# Patient Record
Sex: Male | Born: 1955 | Hispanic: Yes | Marital: Single | Smoking: Former smoker
Health system: Southern US, Community
[De-identification: ages and names within clinical notes are randomized; demographics above are authoritative.]

## PROBLEM LIST (undated history)

## (undated) DIAGNOSIS — E039 Hypothyroidism, unspecified: Secondary | ICD-10-CM

## (undated) DIAGNOSIS — D649 Anemia, unspecified: Secondary | ICD-10-CM

## (undated) HISTORY — PX: HERNIA REPAIR: SHX51

---

## 2017-05-17 ENCOUNTER — Encounter (INDEPENDENT_AMBULATORY_CARE_PROVIDER_SITE_OTHER): Payer: Self-pay

## 2017-05-17 ENCOUNTER — Other Ambulatory Visit: Payer: Self-pay

## 2017-05-17 ENCOUNTER — Encounter: Payer: Self-pay | Admitting: Gastroenterology

## 2017-05-17 ENCOUNTER — Other Ambulatory Visit
Admission: RE | Admit: 2017-05-17 | Discharge: 2017-05-17 | Disposition: A | Discharge status: Home / Self Care | Payer: BLUE CROSS/BLUE SHIELD | Source: Ambulatory Visit | Attending: Gastroenterology | Admitting: Gastroenterology

## 2017-05-17 ENCOUNTER — Ambulatory Visit: Payer: BLUE CROSS/BLUE SHIELD | Admitting: Gastroenterology

## 2017-05-17 VITALS — BP 106/65 | HR 70 | Ht 62.0 in | Wt 126.0 lb

## 2017-05-17 DIAGNOSIS — D509 Iron deficiency anemia, unspecified: Secondary | ICD-10-CM

## 2017-05-17 DIAGNOSIS — R748 Abnormal levels of other serum enzymes: Secondary | ICD-10-CM

## 2017-05-17 LAB — HEPATIC FUNCTION PANEL
ALBUMIN: 3.9 g/dL (ref 3.5–5.0)
ALK PHOS: 75 U/L (ref 38–126)
ALT: 42 U/L (ref 17–63)
AST: 57 U/L — ABNORMAL HIGH (ref 15–41)
Bilirubin, Direct: <0.1 mg/dL — ABNORMAL LOW (ref 0.1–0.5)
TOTAL PROTEIN: 8.1 g/dL (ref 6.5–8.1)
Total Bilirubin: 0.2 mg/dL — ABNORMAL LOW (ref 0.3–1.2)

## 2017-05-17 NOTE — Progress Notes (Signed)
Gastroenterology Consultation  Referring Provider:     Theotis Burrow* Primary Care Physician:  Theotis Burrow, MD Primary Gastroenterologist:  Dr. Allen Norris     Reason for Consultation:     Abnormal liver enzymes and anemia        HPI:   Seth Miles is a 62 y.o. y/o male referred for consultation & management of Abnormal liver enzymes and anemia by Dr. Alene Mires, Elyse Jarvis, MD.  This patient comes in today without any complaints but reports that he was told that he had abnormal liver enzymes and anemia.  The patient states that he had abnormal liver enzymes when he was child but doesn't recall what the reason was.  He also denies any alcohol abuse or jaundice.  The patient also states that he was put on iron supplementation because of his anemia.  There is a report of approximate 10 pound weight loss but the patient states he has recently gained most of it back.  He denies any fevers chills nausea or vomiting.  The patient also denies that he has ever had a colonoscopy in the past. I do not readily have the patient's blood work available to me at this office visit.  History reviewed. No pertinent past medical history.  History reviewed. No pertinent surgical history.  Prior to Admission medications   Medication Sig Start Date End Date Taking? Authorizing Provider  cholecalciferol (VITAMIN D) 1000 units tablet Take 1,000 Units by mouth daily.   Yes [provider]  Ferrous Sulfate (IRON) 325 (65 Fe) MG TABS Take by mouth.   Yes [provider]    History reviewed. No pertinent family history.   Social History   Tobacco Use  . Smoking status: Never Smoker  . Smokeless tobacco: Never Used  Substance Use Topics  . Alcohol use: No    Frequency: Never  . Drug use: No    Allergies as of 05/17/2017  . (No Known Allergies)    Review of Systems:    All systems reviewed and negative except where noted in HPI.   Physical Exam:  BP 106/65    Pulse 70   Ht 5' 2"  (1.575 m)   Wt 126 lb (57.2 kg)   BMI 23.05 kg/m  No LMP for male patient. Psych:  Alert and cooperative. Normal mood and affect. General:   Alert,  Well-developed, well-nourished, pleasant and cooperative in NAD Head:  Normocephalic and atraumatic. Eyes:  Sclera clear, no icterus.   Conjunctiva pink. Ears:  Normal auditory acuity. Nose:  No deformity, discharge, or lesions. Mouth:  No deformity or lesions,oropharynx pink & moist. Neck:  Supple; no masses or thyromegaly. Lungs:  Respirations even and unlabored.  Clear throughout to auscultation.   No wheezes, crackles, or rhonchi. No acute distress. Heart:  Regular rate and rhythm; no murmurs, clicks, rubs, or gallops. Abdomen:  Normal bowel sounds.  No bruits.  Soft, non-tender and non-distended without masses, hepatosplenomegaly or hernias noted.  No guarding or rebound tenderness.  Negative Carnett sign.   Rectal:  Deferred.  Msk:  Symmetrical without gross deformities.  Good, equal movement & strength bilaterally. Pulses:  Normal pulses noted. Extremities:  No clubbing or edema.  No cyanosis. Neurologic:  Alert and oriented x3;  grossly normal neurologically. Skin:  Intact without significant lesions or rashes.  No jaundice. Lymph Nodes:  No significant cervical adenopathy. Psych:  Alert and cooperative. Normal mood and affect.  Imaging Studies: No results found.  Assessment and  Plan:   Seth Miles is a 62 y.o. y/o male Who comes in with a history of anemia being treated with iron at this time.  The patient states that he had some weight loss but has gained most of the weight back.  There is no report of any fevers chills nausea or vomiting.  The patient also denies any black stools or bloody stools.  The patient also was reported to have abnormal liver enzymes.  The patient has not had any alcohol abuse or any other cause for his abnormal liver enzymes.  The patient also denies any takes any medications  other than vitamins.  The patient will be set up for blood work to look for other possible causes of his abnormal liver enzymes and will also be set up for a right upper quadrant ultrasound.  The patient will undergo a EGD and colonoscopy because of his anemia since he has not had either one of these in the past. I have discussed risks & benefits which include, but are not limited to, bleeding, infection, perforation & drug reaction.  The patient agrees with this plan & written consent will be obtained.     Lucilla Lame, MD. Marval Regal   Note: This dictation was prepared with Dragon dictation along with smaller phrase technology. Any transcriptional errors that result from this process are unintentional.

## 2017-05-17 NOTE — Patient Instructions (Signed)
You are scheduled for a RUQ abdominal US at [facility] on Tuesday, March 26th @ 8:30am. Please arrive at 8:15am and check in at the [facility] registration desk. You cannot have anything to eat or drink after midnight on Monday night.

## 2017-05-18 ENCOUNTER — Other Ambulatory Visit: Payer: Self-pay

## 2017-05-18 DIAGNOSIS — D500 Iron deficiency anemia secondary to blood loss (chronic): Secondary | ICD-10-CM

## 2017-05-18 LAB — HEPATITIS A ANTIBODY, TOTAL: HEP A TOTAL AB: NEGATIVE

## 2017-05-18 LAB — IRON AND TIBC
Iron: 94 ug/dL (ref 45–182)
SATURATION RATIOS: 29 % (ref 17.9–39.5)
TIBC: 320 ug/dL (ref 250–450)
UIBC: 226 ug/dL

## 2017-05-18 LAB — ALPHA-1-ANTITRYPSIN: A-1 Antitrypsin, Ser: 162 mg/dL (ref 90–200)

## 2017-05-18 LAB — HEPATITIS C ANTIBODY

## 2017-05-18 LAB — FERRITIN: Ferritin: 41 ng/mL (ref 24–336)

## 2017-05-18 LAB — HEPATITIS B SURFACE ANTIBODY,QUALITATIVE: Hep B S Ab: NONREACTIVE

## 2017-05-18 LAB — HEPATITIS B SURFACE ANTIGEN: HEP B S AG: NEGATIVE

## 2017-05-19 LAB — ANTI-SMOOTH MUSCLE ANTIBODY, IGG: F-Actin IgG: 16 Units (ref 0–19)

## 2017-05-19 LAB — MITOCHONDRIAL ANTIBODIES: Mitochondrial M2 Ab, IgG: <20 Units (ref 0.0–20.0)

## 2017-05-21 LAB — ANTINUCLEAR ANTIBODIES, IFA: ANA Ab, IFA: NEGATIVE

## 2017-05-29 ENCOUNTER — Telehealth: Payer: Self-pay

## 2017-05-29 ENCOUNTER — Other Ambulatory Visit: Payer: Self-pay

## 2017-05-29 DIAGNOSIS — R748 Abnormal levels of other serum enzymes: Secondary | ICD-10-CM

## 2017-05-29 NOTE — Telephone Encounter (Signed)
-----   Message from Lucilla Lame, MD sent at 05/28/2017  5:32 PM EDT ----- That the patient know that all of his blood tests came back without any cause for his elevated liver enzymes.  He should have his liver enzymes checked again in 3 weeks.

## 2017-05-29 NOTE — Telephone Encounter (Signed)
LVM for pt to return my call.

## 2017-05-29 NOTE — Telephone Encounter (Signed)
Pt notified of results and to repeat LFT's in 3 weeks. Order has been placed.

## 2017-06-12 ENCOUNTER — Ambulatory Visit: Admission: RE | Admit: 2017-06-12 | Payer: BLUE CROSS/BLUE SHIELD | Source: Ambulatory Visit

## 2017-06-12 ENCOUNTER — Other Ambulatory Visit: Payer: Self-pay

## 2017-06-12 MED ORDER — PEG 3350-KCL-NABCB-NACL-NASULF 236 G PO SOLR: ORAL | 0 refills | Status: AC

## 2017-06-14 ENCOUNTER — Ambulatory Visit
Admission: RE | Admit: 2017-06-14 | Discharge: 2017-06-14 | Disposition: A | Discharge status: Home / Self Care | Payer: BLUE CROSS/BLUE SHIELD | Source: Ambulatory Visit | Attending: Gastroenterology | Admitting: Gastroenterology

## 2017-06-14 DIAGNOSIS — R748 Abnormal levels of other serum enzymes: Secondary | ICD-10-CM | POA: Diagnosis present

## 2017-06-14 NOTE — Discharge Instructions (Signed)
General Anesthesia, Adult, Care After °These instructions provide you with information about caring for yourself after your procedure. Your health care provider may also give you more specific instructions. Your treatment has been planned according to current medical practices, but problems sometimes occur. Call your health care provider if you have any problems or questions after your procedure. °What can I expect after the procedure? °After the procedure, it is common to have: °· Vomiting. °· A sore throat. °· Mental slowness. ° °It is common to feel: °· Nauseous. °· Cold or shivery. °· Sleepy. °· Tired. °· Sore or achy, even in parts of your body where you did not have surgery. ° °Follow these instructions at home: °For at least 24 hours after the procedure: °· Do not: °? Participate in activities where you could fall or become injured. °? Drive. °? Use heavy machinery. °? Drink alcohol. °? Take sleeping pills or medicines that cause drowsiness. °? Make important decisions or sign legal documents. °? Take care of children on your own. °· Rest. °Eating and drinking °· If you vomit, drink water, juice, or soup when you can drink without vomiting. °· Drink enough fluid to keep your urine clear or pale yellow. °· Make sure you have little or no nausea before eating solid foods. °· Follow the diet recommended by your health care provider. °General instructions °· Have a responsible adult stay with you until you are awake and alert. °· Return to your normal activities as told by your health care provider. Ask your health care provider what activities are safe for you. °· Take over-the-counter and prescription medicines only as told by your health care provider. °· If you smoke, do not smoke without supervision. °· Keep all follow-up visits as told by your health care provider. This is important. °Contact a health care provider if: °· You continue to have nausea or vomiting at home, and medicines are not helpful. °· You  cannot drink fluids or start eating again. °· You cannot urinate after 8-12 hours. °· You develop a skin rash. °· You have fever. °· You have increasing redness at the site of your procedure. °Get help right away if: °· You have difficulty breathing. °· You have chest pain. °· You have unexpected bleeding. °· You feel that you are having a life-threatening or urgent problem. °This information is not intended to replace advice given to you by your health care provider. Make sure you discuss any questions you have with your health care provider. °Document Released: 06/12/2000 Document Revised: 08/09/2015 Document Reviewed: 02/18/2015 °Elsevier Interactive Patient Education © 2018 [employer] Inc. ° °

## 2017-06-15 ENCOUNTER — Ambulatory Visit
Admission: RE | Admit: 2017-06-15 | Discharge: 2017-06-15 | Disposition: A | Discharge status: Home / Self Care | Payer: BLUE CROSS/BLUE SHIELD | Source: Ambulatory Visit | Attending: Gastroenterology | Admitting: Gastroenterology

## 2017-06-15 ENCOUNTER — Ambulatory Visit: Payer: BLUE CROSS/BLUE SHIELD | Admitting: Anesthesiology

## 2017-06-15 ENCOUNTER — Encounter
Admission: RE | Disposition: A | Discharge status: Home / Self Care | Payer: Self-pay | Source: Ambulatory Visit | Attending: Gastroenterology

## 2017-06-15 ENCOUNTER — Encounter: Payer: Self-pay | Admitting: *Deleted

## 2017-06-15 DIAGNOSIS — K900 Celiac disease: Secondary | ICD-10-CM | POA: Diagnosis not present

## 2017-06-15 DIAGNOSIS — K3189 Other diseases of stomach and duodenum: Secondary | ICD-10-CM

## 2017-06-15 DIAGNOSIS — Z87891 Personal history of nicotine dependence: Secondary | ICD-10-CM | POA: Insufficient documentation

## 2017-06-15 DIAGNOSIS — Z79899 Other long term (current) drug therapy: Secondary | ICD-10-CM | POA: Insufficient documentation

## 2017-06-15 DIAGNOSIS — D509 Iron deficiency anemia, unspecified: Secondary | ICD-10-CM | POA: Insufficient documentation

## 2017-06-15 DIAGNOSIS — E039 Hypothyroidism, unspecified: Secondary | ICD-10-CM | POA: Diagnosis not present

## 2017-06-15 HISTORY — PX: ESOPHAGOGASTRODUODENOSCOPY (EGD) WITH PROPOFOL: SHX5813

## 2017-06-15 HISTORY — DX: Anemia, unspecified: D64.9

## 2017-06-15 HISTORY — DX: Hypothyroidism, unspecified: E03.9

## 2017-06-15 SURGERY — ESOPHAGOGASTRODUODENOSCOPY (EGD) WITH PROPOFOL
Anesthesia: General | Wound class: Clean Contaminated

## 2017-06-15 MED ORDER — PROPOFOL 10 MG/ML IV BOLUS
INTRAVENOUS | Status: DC | PRN
  Administered 2017-06-15: 60 mg via INTRAVENOUS
  Administered 2017-06-15: 90 mg via INTRAVENOUS
  Administered 2017-06-15 (×2): 25 mg via INTRAVENOUS

## 2017-06-15 MED ORDER — LIDOCAINE HCL (CARDIAC) 20 MG/ML IV SOLN
INTRAVENOUS | Status: DC | PRN
  Administered 2017-06-15: 25 mg via INTRAVENOUS

## 2017-06-15 MED ORDER — GLYCOPYRROLATE 0.2 MG/ML IJ SOLN
INTRAMUSCULAR | Status: DC | PRN
  Administered 2017-06-15: 0.1 mg via INTRAVENOUS

## 2017-06-15 MED ORDER — LACTATED RINGERS IV SOLN
1000.0000 mL | INTRAVENOUS | Status: DC
  Administered 2017-06-15: 11:00:00 via INTRAVENOUS
  Administered 2017-06-15: 1000 mL via INTRAVENOUS

## 2017-06-15 SURGICAL SUPPLY — 36 items
BALLN DILATOR 10-12 8 (BALLOONS)
BALLN DILATOR 12-15 8 (BALLOONS)
BALLN DILATOR 15-18 8 (BALLOONS)
BALLN DILATOR CRE 0-12 8 (BALLOONS)
BALLN DILATOR ESOPH 8 10 CRE (MISCELLANEOUS) IMPLANT
BALLOON DILATOR 12-15 8 (BALLOONS) IMPLANT
BALLOON DILATOR 15-18 8 (BALLOONS) IMPLANT
BALLOON DILATOR CRE 0-12 8 (BALLOONS) IMPLANT
BLOCK BITE 60FR ADLT L/F GRN (MISCELLANEOUS) ×3 IMPLANT
CANISTER SUCT 1200ML W/VALVE (MISCELLANEOUS) ×3 IMPLANT
CLIP HMST 235XBRD CATH ROT (MISCELLANEOUS) IMPLANT
CLIP RESOLUTION 360 11X235 (MISCELLANEOUS)
ELECT REM PT RETURN 9FT ADLT (ELECTROSURGICAL)
ELECTRODE REM PT RTRN 9FT ADLT (ELECTROSURGICAL) IMPLANT
FCP ESCP3.2XJMB 240X2.8X (MISCELLANEOUS)
FORCEPS BIOP RAD 4 LRG CAP 4 (CUTTING FORCEPS) ×3 IMPLANT
FORCEPS BIOP RJ4 240 W/NDL (MISCELLANEOUS)
FORCEPS ESCP3.2XJMB 240X2.8X (MISCELLANEOUS) IMPLANT
GOWN CVR UNV OPN BCK APRN NK (MISCELLANEOUS) ×2 IMPLANT
GOWN ISOL THUMB LOOP REG UNIV (MISCELLANEOUS) ×4
INJECTOR VARIJECT VIN23 (MISCELLANEOUS) IMPLANT
KIT DEFENDO VALVE AND CONN (KITS) IMPLANT
KIT ENDO PROCEDURE OLY (KITS) ×3 IMPLANT
MARKER SPOT ENDO TATTOO 5ML (MISCELLANEOUS) IMPLANT
PROBE APC STR FIRE (PROBE) IMPLANT
RETRIEVER NET PLAT FOOD (MISCELLANEOUS) IMPLANT
RETRIEVER NET ROTH 2.5X230 LF (MISCELLANEOUS) IMPLANT
SNARE SHORT THROW 13M SML OVAL (MISCELLANEOUS) IMPLANT
SNARE SHORT THROW 30M LRG OVAL (MISCELLANEOUS) IMPLANT
SNARE SNG USE RND 15MM (INSTRUMENTS) IMPLANT
SPOT EX ENDOSCOPIC TATTOO (MISCELLANEOUS)
SYR INFLATION 60ML (SYRINGE) IMPLANT
TRAP ETRAP POLY (MISCELLANEOUS) IMPLANT
VARIJECT INJECTOR VIN23 (MISCELLANEOUS)
WATER STERILE IRR 250ML POUR (IV SOLUTION) ×3 IMPLANT
WIRE CRE 18-20MM 8CM F G (MISCELLANEOUS) IMPLANT

## 2017-06-15 NOTE — Anesthesia Procedure Notes (Signed)
Procedure Name: MAC Performed by: Lind Guest, CRNA Pre-anesthesia Checklist: Patient identified, Emergency Drugs available, Suction available, Patient being monitored and Timeout performed Patient Re-evaluated:Patient Re-evaluated prior to induction Oxygen Delivery Method: Nasal cannula

## 2017-06-15 NOTE — Op Note (Signed)
[facility] Gastroenterology Patient Name: Seth Miles [facility] Procedure Date: 06/15/2017 10:50 AM MRN: [id number] Account #: [id number] Date of Birth: Apr 19, 1955 Admit Type: Outpatient Age: 62 Room: [facility] OR ROOM 01 Gender: Male Note Status: Finalized Procedure:            Upper GI endoscopy Indications:          Iron deficiency anemia Providers:            Lucilla Lame MD, MD Referring MD:         Dyke Maes. Mancheno Revelo (Referring MD) Medicines:            Propofol per Anesthesia Complications:        No immediate complications. Procedure:            Pre-Anesthesia Assessment:                       - Prior to the procedure, a History and Physical was                        performed, and patient medications and allergies were                        reviewed. The patient's tolerance of previous                        anesthesia was also reviewed. The risks and benefits of                        the procedure and the sedation options and risks were                        discussed with the patient. All questions were                        answered, and informed consent was obtained. Prior                        Anticoagulants: The patient has taken no previous                        anticoagulant or antiplatelet agents. ASA Grade                        Assessment: II - A patient with mild systemic disease.                        After reviewing the risks and benefits, the patient was                        deemed in satisfactory condition to undergo the                        procedure.                       After obtaining informed consent, the endoscope was                        passed under direct vision. Throughout the procedure,  the patient's blood pressure, pulse, and oxygen                        saturations were monitored continuously. The Olympus                        [id number]-[id number] Endoscope (S#. [id number]) was introduced             through the mouth, and advanced to the second part of                        duodenum. The upper GI endoscopy was accomplished                        without difficulty. The patient tolerated the procedure                        well. Findings:      The Z-line was irregular and was found at the gastroesophageal junction.      The stomach was normal.      Mucosal flattening was found in the entire examined duodenum. Biopsies       were taken with a cold forceps for histology. Impression:           - Z-line irregular, at the gastroesophageal junction.                       - Normal stomach.                       - Flattened mucosa was found in the duodenum. Biopsied. Recommendation:       - Discharge patient to home.                       - Resume previous diet.                       - Continue present medications.                       - Await pathology results. Procedure Code(s):    --- Professional ---                       [phone], Esophagogastroduodenoscopy, flexible, transoral;                        with biopsy, single or multiple Diagnosis Code(s):    --- Professional ---                       D50.9, Iron deficiency anemia, unspecified                       K31.89, Other diseases of stomach and duodenum CPT copyright 2016 American Medical Association. All rights reserved. The codes documented in this report are preliminary and upon coder review may  be revised to meet current compliance requirements. Lucilla Lame MD, MD 06/15/2017 11:08:23 AM This report has been signed electronically. Number of Addenda: 0 Note Initiated On: 06/15/2017 10:50 AM      [facility]

## 2017-06-15 NOTE — Transfer of Care (Signed)
Immediate Anesthesia Transfer of Care Note  Patient: Seth Miles  Procedure(s) Performed: COLONOSCOPY WITH PROPOFOL (N/A ) ESOPHAGOGASTRODUODENOSCOPY (EGD) WITH PROPOFOL (N/A )  Patient Location: PACU  Anesthesia Type: General  Level of Consciousness: awake, alert  and patient cooperative  Airway and Oxygen Therapy: Patient Spontanous Breathing and Patient connected to supplemental oxygen  Post-op Assessment: Post-op Vital signs reviewed, Patient's Cardiovascular Status Stable, Respiratory Function Stable, Patent Airway and No signs of Nausea or vomiting  Post-op Vital Signs: Reviewed and stable  Complications: No apparent anesthesia complications

## 2017-06-15 NOTE — Anesthesia Preprocedure Evaluation (Addendum)
Anesthesia Evaluation  Patient identified by MRN, date of birth, ID band  Reviewed: Allergy & Precautions, NPO status , Patient's Chart, lab work & pertinent test results  Airway Mallampati: II  TM Distance: >3 FB Neck ROM: Full    Dental  (+) Edentulous Upper, Edentulous Lower   Pulmonary former smoker,    Pulmonary exam normal breath sounds clear to auscultation       Cardiovascular negative cardio ROS Normal cardiovascular exam Rhythm:Regular Rate:Normal     Neuro/Psych negative neurological ROS  negative psych ROS   GI/Hepatic Neg liver ROS, Elevated LFTs   Endo/Other  Hypothyroidism   Renal/GU negative Renal ROS  negative genitourinary   Musculoskeletal negative musculoskeletal ROS (+)   Abdominal Normal abdominal exam  (+)   Peds  Hematology  (+) anemia ,   Anesthesia Other Findings   Reproductive/Obstetrics                            Anesthesia Physical Anesthesia Plan  ASA: II  Anesthesia Plan: General   Post-op Pain Management:    Induction: Intravenous  PONV Risk Score and Plan: TIVA  Airway Management Planned: Natural Airway  Additional Equipment: None  Intra-op Plan:   Post-operative Plan:   Informed Consent: I have reviewed the patients History and Physical, chart, labs and discussed the procedure including the risks, benefits and alternatives for the proposed anesthesia with the patient or authorized representative who has indicated his/her understanding and acceptance.     Plan Discussed with: CRNA, Anesthesiologist and Surgeon  Anesthesia Plan Comments:         Anesthesia Quick Evaluation

## 2017-06-15 NOTE — H&P (Signed)
Seth Lame, MD [address] [place], [place] [address] Phone:[phone] Fax : [phone]  Primary Care Physician:  Seth Burrow, MD Primary Gastroenterologist:  Dr. Allen Miles  Pre-Procedure History & Physical: HPI:  Seth Miles is a 62 y.o. male is here for an endoscopy and colonoscopy.   Past Medical History:  Diagnosis Date  . Anemia   . Hypothyroidism     Past Surgical History:  Procedure Laterality Date  . HERNIA REPAIR  1980s    Prior to Admission medications   Medication Sig Start Date End Date Taking? Authorizing Provider  cholecalciferol (VITAMIN D) 1000 units tablet Take 1,000 Units by mouth daily.   Yes [provider]  Ferrous Sulfate (IRON) 325 (65 Fe) MG TABS Take by mouth.   Yes [provider]  levothyroxine (SYNTHROID, LEVOTHROID) 100 MCG tablet Take 100 mcg by mouth daily before breakfast.   Yes [provider]  polyethylene glycol (GOLYTELY) 236 g solution Drink one 8 oz glass every 20 mins until stools are clear starting at 5:00pm on 06/14/17. 06/12/17  Yes Seth Lame, MD    Allergies as of 05/18/2017  . (No Known Allergies)    History reviewed. No pertinent family history.  Social History   Socioeconomic History  . Marital status: Single    Spouse name: Not on file  . Number of children: Not on file  . Years of education: Not on file  . Highest education level: Not on file  Occupational History  . Not on file  Social Needs  . Financial resource strain: Not on file  . Food insecurity:    Worry: Not on file    Inability: Not on file  . Transportation needs:    Medical: Not on file    Non-medical: Not on file  Tobacco Use  . Smoking status: Former Research scientist (life sciences)  . Smokeless tobacco: Never Used  . Tobacco comment: quit in his 78s  Substance and Sexual Activity  . Alcohol use: No    Frequency: Never  . Drug use: No  . Sexual activity: Not on file  Lifestyle  . Physical activity:   Days per week: Not on file    Minutes per session: Not on file  . Stress: Not on file  Relationships  . Social connections:    Talks on phone: Not on file    Gets together: Not on file    Attends religious service: Not on file    Active member of club or organization: Not on file    Attends meetings of clubs or organizations: Not on file    Relationship status: Not on file  . Intimate partner violence:    Fear of current or ex partner: Not on file    Emotionally abused: Not on file    Physically abused: Not on file    Forced sexual activity: Not on file  Other Topics Concern  . Not on file  Social History Narrative  . Not on file    Review of Systems: See HPI, otherwise negative ROS  Physical Exam: There were no vitals taken for this visit. General:   Alert,  pleasant and cooperative in NAD Head:  Normocephalic and atraumatic. Neck:  Supple; no masses or thyromegaly. Lungs:  Clear throughout to auscultation.    Heart:  Regular rate and rhythm. Abdomen:  Soft, nontender and nondistended. Normal bowel sounds, without guarding, and without rebound.   Neurologic:  Alert and  oriented x4;  grossly normal neurologically.  Impression/Plan: Seth Miles is here for an endoscopy and colonoscopy to be performed for Anemia  Risks, benefits, limitations, and alternatives regarding  endoscopy and colonoscopy have been reviewed with the patient.  Questions have been answered.  All parties agreeable.   Seth Lame, MD  06/15/2017, 10:18 AM

## 2017-06-15 NOTE — Anesthesia Postprocedure Evaluation (Signed)
Anesthesia Post Note  Patient: Seth Miles  Procedure(s) Performed: ESOPHAGOGASTRODUODENOSCOPY (EGD) WITH PROPOFOL (N/A )  Patient location during evaluation: PACU Anesthesia Type: General Level of consciousness: awake Pain management: pain level controlled Vital Signs Assessment: post-procedure vital signs reviewed and stable Respiratory status: spontaneous breathing Cardiovascular status: blood pressure returned to baseline Postop Assessment: no headache Anesthetic complications: no    Lavonna Monarch

## 2017-06-18 ENCOUNTER — Encounter: Payer: Self-pay | Admitting: Gastroenterology

## 2017-06-19 ENCOUNTER — Other Ambulatory Visit: Payer: Self-pay

## 2017-06-19 DIAGNOSIS — R1084 Generalized abdominal pain: Secondary | ICD-10-CM

## 2017-06-21 ENCOUNTER — Other Ambulatory Visit
Admission: RE | Admit: 2017-06-21 | Discharge: 2017-06-21 | Disposition: A | Discharge status: Home / Self Care | Payer: BLUE CROSS/BLUE SHIELD | Source: Ambulatory Visit | Attending: Gastroenterology | Admitting: Gastroenterology

## 2017-06-21 ENCOUNTER — Telehealth: Payer: Self-pay

## 2017-06-21 DIAGNOSIS — R1084 Generalized abdominal pain: Secondary | ICD-10-CM | POA: Diagnosis not present

## 2017-06-21 NOTE — Telephone Encounter (Signed)
Pt notified of results and to check for celiac disease. Lab order has been placed.

## 2017-06-21 NOTE — Telephone Encounter (Signed)
-----   Message from Lucilla Lame, MD sent at 06/19/2017 10:39 AM EDT ----- This patient needs a blood test for Sprue.

## 2017-06-22 LAB — GLIA (IGA/G) + TTG IGA
Antigliadin Abs, IgA: 34 units — ABNORMAL HIGH (ref 0–19)
GLIADIN IGG: 3 U (ref 0–19)
TISSUE TRANSGLUTAMINASE AB, IGA: 14 U/mL — AB (ref 0–3)

## 2017-06-26 ENCOUNTER — Telehealth: Payer: Self-pay

## 2017-06-26 NOTE — Telephone Encounter (Signed)
-----   Message from Lucilla Lame, MD sent at 06/25/2017  6:24 PM EDT ----- Please have the patient come in for an office visit to discuss his positive celiac sprue biopsies and blood work.

## 2017-06-26 NOTE — Telephone Encounter (Signed)
Pt notified of results. Pt was getting ready to start work. Will call back on his break to schedule a follow up appt.

## 2017-07-31 LAB — CERULOPLASMIN: Ceruloplasmin: UNDETERMINED

## 2019-01-04 ENCOUNTER — Ambulatory Visit
Admission: EM | Admit: 2019-01-04 | Discharge: 2019-01-04 | Disposition: A | Discharge status: Home / Self Care | Payer: BLUE CROSS/BLUE SHIELD | Attending: Family Medicine | Admitting: Family Medicine

## 2019-01-04 ENCOUNTER — Other Ambulatory Visit: Payer: Self-pay

## 2019-01-04 ENCOUNTER — Encounter: Payer: Self-pay | Admitting: Emergency Medicine

## 2019-01-04 ENCOUNTER — Ambulatory Visit (INDEPENDENT_AMBULATORY_CARE_PROVIDER_SITE_OTHER): Payer: BLUE CROSS/BLUE SHIELD

## 2019-01-04 DIAGNOSIS — K5900 Constipation, unspecified: Secondary | ICD-10-CM | POA: Diagnosis not present

## 2019-01-04 DIAGNOSIS — R109 Unspecified abdominal pain: Secondary | ICD-10-CM | POA: Diagnosis not present

## 2019-01-04 NOTE — ED Triage Notes (Signed)
Pt c/o constipation. He states his last BM was about 6 days ago. He had one episode of vomiting about 6 days ago but denies any since then. Denies nausea, or abdominal pain. He normally has a BM about every 3 days.

## 2019-01-04 NOTE — ED Provider Notes (Signed)
[facility]    CSN: [id number] Arrival date & time: 01/04/19  4496      History   Chief Complaint Chief Complaint  Patient presents with  . Constipation    HPI Seth Miles is a 63 y.o. male.   63 yo male with a c/o constipation. States he normally has a bowel movement every 3 days, however states now it's been 6 days. Denies any nausea, fevers, chills, abdominal pain. States he vomited once about 6 days ago. Has tried over the counter Mylanta, Maalox, colace.    Constipation   Past Medical History:  Diagnosis Date  . Anemia   . Hypothyroidism     Patient Active Problem List   Diagnosis Date Noted  . Iron deficiency anemia   . Other diseases of stomach and duodenum     Past Surgical History:  Procedure Laterality Date  . ESOPHAGOGASTRODUODENOSCOPY (EGD) WITH PROPOFOL N/A 06/15/2017   Procedure: ESOPHAGOGASTRODUODENOSCOPY (EGD) WITH PROPOFOL;  Surgeon: Lucilla Lame, MD;  Location: [place];  Service: Endoscopy;  Laterality: N/A;  . HERNIA REPAIR  1980s       Home Medications    Prior to Admission medications   Medication Sig Start Date End Date Taking? Authorizing Provider  cholecalciferol (VITAMIN D) 1000 units tablet Take 1,000 Units by mouth daily.    [provider]  Ferrous Sulfate (IRON) 325 (65 Fe) MG TABS Take by mouth.    [provider]  levothyroxine (SYNTHROID, LEVOTHROID) 100 MCG tablet Take 100 mcg by mouth daily before breakfast.    [provider]  polyethylene glycol (GOLYTELY) 236 g solution Drink one 8 oz glass every 20 mins until stools are clear starting at 5:00pm on 06/14/17. 06/12/17   Lucilla Lame, MD    Family History History reviewed. No pertinent family history.  Social History Social History   Tobacco Use  . Smoking status: Former Research scientist (life sciences)  . Smokeless tobacco: Never Used  . Tobacco comment: quit in his 29s  Substance Use Topics  . Alcohol use: No    Frequency: Never  .  Drug use: No     Allergies   Patient has no known allergies.   Review of Systems Review of Systems  Gastrointestinal: Positive for constipation.     Physical Exam Triage Vital Signs ED Triage Vitals  Enc Vitals Group     BP 01/04/19 1015 (!) 141/95     Pulse Rate 01/04/19 1015 74     Resp 01/04/19 1015 16     Temp 01/04/19 1015 97.8 F (36.6 C)     Temp Source 01/04/19 1015 Oral     SpO2 01/04/19 1015 100 %     Weight 01/04/19 1012 120 lb (54.4 kg)     Height 01/04/19 1012 5' 2"  (1.575 m)     Head Circumference --      Peak Flow --      Pain Score 01/04/19 1012 0     Pain Loc --      Pain Edu? --      Excl. in [place]? --    No data found.  Updated Vital Signs BP (!) 141/95 (BP Location: Left Arm)   Pulse 74   Temp 97.8 F (36.6 C) (Oral)   Resp 16   Ht 5' 2"  (1.575 m)   Wt 54.4 kg   SpO2 100%   BMI 21.95 kg/m   Visual Acuity Right Eye Distance:   Left Eye Distance:   Bilateral Distance:  Right Eye Near:   Left Eye Near:    Bilateral Near:     Physical Exam Vitals signs reviewed.  Constitutional:      General: He is not in acute distress.    Appearance: He is not toxic-appearing or diaphoretic.  Cardiovascular:     Rate and Rhythm: Normal rate.  Pulmonary:     Effort: Pulmonary effort is normal. No respiratory distress.  Abdominal:     General: Bowel sounds are normal. There is no distension.     Palpations: Abdomen is soft. There is no mass.     Tenderness: There is abdominal tenderness (mild; diffuse). There is no right CVA tenderness, left CVA tenderness, guarding or rebound.     Hernia: No hernia is present.  Neurological:     Mental Status: He is alert.      UC Treatments / Results  Labs (all labs ordered are listed, but only abnormal results are displayed) Labs Reviewed - No data to display  EKG   Radiology Dg Abd 2 Views  Result Date: 01/04/2019 CLINICAL DATA:  Constipation EXAM: ABDOMEN - 2 VIEW COMPARISON:  None. FINDINGS:  Normal heart size.  Clear lung bases.  No free air. Moderate colonic stool burden in the left abdomen throughout the hepatic flexure and left descending colon. No significant dilatation, ileus or obstruction pattern. Benign pelvic vascular calcifications. No acute osseous finding. IMPRESSION: Moderate left colonic stool burden without obstruction pattern or ileus. Electronically Signed   By: Jerilynn Mages.  Shick M.D.   On: 01/04/2019 10:57    Procedures Procedures (including critical care time)  Medications Ordered in UC Medications - No data to display  Initial Impression / Assessment and Plan / UC Course  I have reviewed the triage vital signs and the nursing notes.  Pertinent labs & imaging results that were available during my care of the patient were reviewed by me and considered in my medical decision making (see chart for details).      Final Clinical Impressions(s) / UC Diagnoses   Final diagnoses:  Constipation, unspecified constipation type     Discharge Instructions     Miralax (oral) Fleet Enema (rectal) Go to Emergency Department at hospital if no improvement    ED Prescriptions    None      1. x-ray results and diagnosis reviewed with patient 2. Recommend supportive treatment as above 3. Follow-up prn if symptoms worsen or don't improve  PDMP not reviewed this encounter.   Norval Gable, MD 01/04/19 1257

## 2019-01-04 NOTE — Discharge Instructions (Signed)
Miralax (oral) Fleet Enema (rectal) Go to Emergency Department at hospital if no improvement

## 2019-01-30 ENCOUNTER — Emergency Department: Payer: BLUE CROSS/BLUE SHIELD

## 2019-01-30 ENCOUNTER — Encounter: Payer: Self-pay | Admitting: Emergency Medicine

## 2019-01-30 ENCOUNTER — Other Ambulatory Visit: Payer: Self-pay

## 2019-01-30 DIAGNOSIS — Z87891 Personal history of nicotine dependence: Secondary | ICD-10-CM | POA: Insufficient documentation

## 2019-01-30 DIAGNOSIS — E039 Hypothyroidism, unspecified: Secondary | ICD-10-CM | POA: Diagnosis not present

## 2019-01-30 DIAGNOSIS — Z79899 Other long term (current) drug therapy: Secondary | ICD-10-CM | POA: Diagnosis not present

## 2019-01-30 DIAGNOSIS — K5900 Constipation, unspecified: Secondary | ICD-10-CM | POA: Insufficient documentation

## 2019-01-30 IMAGING — CR DG ABDOMEN 1V
1 series · 1 of 1 positions shown · non-contrast
Comparison: [DATE]

CLINICAL DATA: Constipation

EXAM:
ABDOMEN - 1 VIEW

[dg abd 1 view]
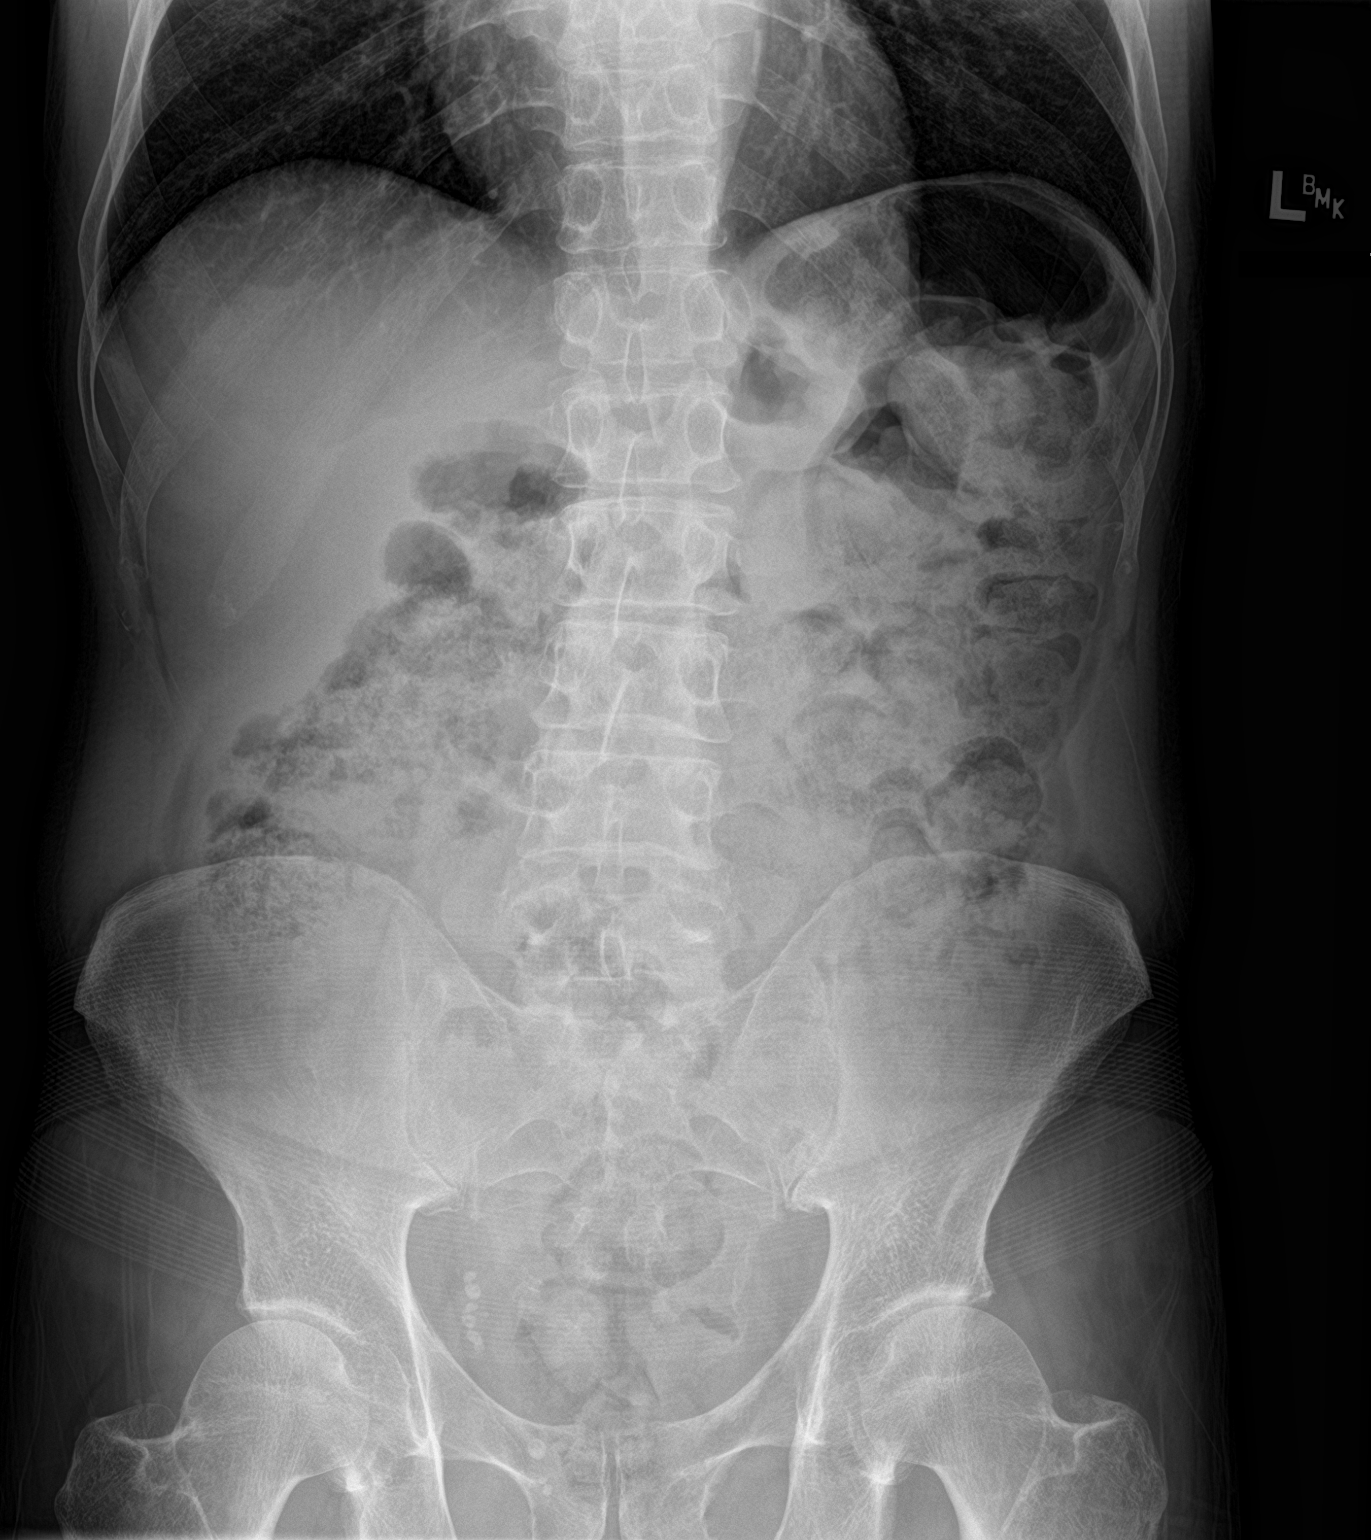

[1 of 1 positions shown; findings below may reference images not displayed]

FINDINGS: Considerable retained fecal material is noted throughout the colon.
No obstructive changes are seen. Multiple phleboliths are noted
within the pelvis. No free air is noted. No bony abnormality is
seen.
IMPRESSION: Retained fecal material consistent with constipation.

## 2019-01-30 NOTE — ED Triage Notes (Signed)
Patient ambulatory to triage with steady gait, without difficulty or distress noted, mask in place; pt reports constipation since Saturday unrelieved by OTC meds; denies any abd pain or accomp symptoms

## 2019-01-31 ENCOUNTER — Emergency Department
Admission: EM | Admit: 2019-01-31 | Discharge: 2019-01-31 | Disposition: A | Discharge status: Home / Self Care | Payer: BLUE CROSS/BLUE SHIELD | Attending: Emergency Medicine | Admitting: Emergency Medicine

## 2019-01-31 DIAGNOSIS — K5900 Constipation, unspecified: Secondary | ICD-10-CM

## 2019-01-31 MED ORDER — LACTULOSE 10 GM/15ML PO SOLN
20.0000 g | Freq: Once | ORAL | Status: AC
Start: 2019-01-31 — End: 2019-01-31
  Administered 2019-01-31: 01:00:00 20 g via ORAL
  Filled 2019-01-31: qty 30

## 2019-01-31 MED ORDER — SENNOSIDES-DOCUSATE SODIUM 8.6-50 MG PO TABS: 2.0000 | ORAL_TABLET | Freq: Every evening | ORAL | 0 refills | Status: DC | PRN

## 2019-01-31 NOTE — ED Provider Notes (Signed)
[facility] Emergency Department Provider Note __   First MD Initiated Contact with Patient 01/31/19 0025     (approximate)  I have reviewed the triage vital signs and the nursing notes.   HISTORY  Chief Complaint Constipation    HPI Seth Miles is a 63 y.o. male with below list of previous medical conditions presents to the emergency department secondary to constipation times 6 days.  Patient denies any abdominal pain.  Patient denies any nausea or vomiting.  Patient denies any fever.  Patient states that he is taking Metamucil over-the-counter without any relief.       Past Medical History:  Diagnosis Date  . Anemia   . Hypothyroidism     Patient Active Problem List   Diagnosis Date Noted  . Iron deficiency anemia   . Other diseases of stomach and duodenum     Past Surgical History:  Procedure Laterality Date  . ESOPHAGOGASTRODUODENOSCOPY (EGD) WITH PROPOFOL N/A 06/15/2017   Procedure: ESOPHAGOGASTRODUODENOSCOPY (EGD) WITH PROPOFOL;  Surgeon: Lucilla Lame, MD;  Location: [place];  Service: Endoscopy;  Laterality: N/A;  . HERNIA REPAIR  1980s    Prior to Admission medications   Medication Sig Start Date End Date Taking? Authorizing Provider  cholecalciferol (VITAMIN D) 1000 units tablet Take 1,000 Units by mouth daily.    [provider]  Ferrous Sulfate (IRON) 325 (65 Fe) MG TABS Take by mouth.    [provider]  levothyroxine (SYNTHROID, LEVOTHROID) 100 MCG tablet Take 100 mcg by mouth daily before breakfast.    [provider]  polyethylene glycol (GOLYTELY) 236 g solution Drink one 8 oz glass every 20 mins until stools are clear starting at 5:00pm on 06/14/17. 06/12/17   Lucilla Lame, MD    Allergies Patient has no known allergies.  No family history on file.  Social History Social History   Tobacco Use  . Smoking status: Former Research scientist (life sciences)  . Smokeless tobacco: Never Used  . Tobacco  comment: quit in his 15s  Substance Use Topics  . Alcohol use: No    Frequency: Never  . Drug use: No    Review of Systems Constitutional: No fever/chills Eyes: No visual changes. ENT: No sore throat. Cardiovascular: Denies chest pain. Respiratory: Denies shortness of breath. Gastrointestinal: No abdominal pain.  No nausea, no vomiting.  No diarrhea.  Positive for constipation. Genitourinary: Negative for dysuria. Musculoskeletal: Negative for neck pain.  Negative for back pain. Integumentary: Negative for rash. Neurological: Negative for headaches, focal weakness or numbness.  ____________________________________________   PHYSICAL EXAM:  VITAL SIGNS: ED Triage Vitals  Enc Vitals Group     BP 01/30/19 2310 101/88     Pulse Rate 01/30/19 2310 72     Resp 01/30/19 2310 18     Temp 01/30/19 2310 (!) 97.1 F (36.2 C)     Temp src --      SpO2 01/30/19 2310 99 %     Weight 01/30/19 2308 59 kg (130 lb)     Height 01/30/19 2308 1.575 m (5' 2" )     Head Circumference --      Peak Flow --      Pain Score 01/30/19 2308 0     Pain Loc --      Pain Edu? --      Excl. in [place]? --     Constitutional: Alert and oriented.  Eyes: Conjunctivae are normal.  Mouth/Throat: Patient is wearing a mask. Neck: No stridor.  No meningeal signs.   Cardiovascular: Normal rate, regular rhythm. Good peripheral circulation. Grossly normal heart sounds. Respiratory: Normal respiratory effort.  No retractions. Gastrointestinal: Soft and nontender. No distention.  Musculoskeletal: No lower extremity tenderness nor edema. No gross deformities of extremities. Neurologic:  Normal speech and language. No gross focal neurologic deficits are appreciated.  Skin:  Skin is warm, dry and intact. Psychiatric: Mood and affect are normal. Speech and behavior are normal.  _____  RADIOLOGY I, C[place], personally viewed and evaluated these images (plain radiographs) as part of my medical decision  making, as well as reviewing the written report by the radiologist.  ED MD interpretation: Retained fecal material consistent with constipation per radiologist on abdomen x-ray interpretation.  Official radiology report(s): Dg Abdomen 1 View  Result Date: 01/30/2019 CLINICAL DATA:  Constipation EXAM: ABDOMEN - 1 VIEW COMPARISON:  01/04/2019 FINDINGS: Considerable retained fecal material is noted throughout the colon. No obstructive changes are seen. Multiple phleboliths are noted within the pelvis. No free air is noted. No bony abnormality is seen. IMPRESSION: Retained fecal material consistent with constipation. Electronically Signed   By: Inez Catalina M.D.   On: 01/30/2019 23:31      Procedures   ____________________________________________   INITIAL IMPRESSION / MDM / [place] / ED COURSE  As part of my medical decision making, I reviewed the following data within the electronic MEDICAL RECORD NUMBER   63 year old male presented with above-stated history and physical exam consistent with constipation.  Patient given lactulose in the emergency department and a prescription for senna plus at home.  Patient advised how to use the     ____________________________________________  FINAL CLINICAL IMPRESSION(S) / ED DIAGNOSES  Final diagnoses:  Constipation, unspecified constipation type     MEDICATIONS GIVEN DURING THIS VISIT:  Medications  lactulose (CHRONULAC) 10 GM/15ML solution 20 g (has no administration in time range)     ED Discharge Orders    None      *Please note:  Seth Miles was evaluated in Emergency Department on 01/31/2019 for the symptoms described in the history of present illness. He was evaluated in the context of the global COVID-19 pandemic, which necessitated consideration that the patient might be at risk for infection with the SARS-CoV-2 virus that causes COVID-19. Institutional protocols and algorithms that pertain to the evaluation  of patients at risk for COVID-19 are in a state of rapid change based on information released by regulatory bodies including the CDC and federal and state organizations. These policies and algorithms were followed during the patient's care in the ED.  Some ED evaluations and interventions may be delayed as a result of limited staffing during the pandemic.*  Note:  This document was prepared using Dragon voice recognition software and may include unintentional dictation errors.   Gregor Hams, MD 01/31/19 [phone]

## 2019-02-07 ENCOUNTER — Other Ambulatory Visit: Payer: Self-pay | Admitting: Internal Medicine

## 2019-02-07 DIAGNOSIS — I739 Peripheral vascular disease, unspecified: Secondary | ICD-10-CM

## 2019-02-07 DIAGNOSIS — Z0001 Encounter for general adult medical examination with abnormal findings: Secondary | ICD-10-CM

## 2019-02-12 ENCOUNTER — Other Ambulatory Visit: Payer: Self-pay

## 2019-02-12 ENCOUNTER — Ambulatory Visit
Admission: RE | Admit: 2019-02-12 | Discharge: 2019-02-12 | Disposition: A | Discharge status: Home / Self Care | Payer: BLUE CROSS/BLUE SHIELD | Source: Ambulatory Visit | Attending: Internal Medicine | Admitting: Internal Medicine

## 2019-02-12 DIAGNOSIS — I739 Peripheral vascular disease, unspecified: Secondary | ICD-10-CM | POA: Diagnosis not present

## 2019-02-12 DIAGNOSIS — Z0001 Encounter for general adult medical examination with abnormal findings: Secondary | ICD-10-CM | POA: Insufficient documentation

## 2019-04-01 ENCOUNTER — Emergency Department: Payer: BLUE CROSS/BLUE SHIELD

## 2019-04-01 ENCOUNTER — Encounter: Payer: Self-pay | Admitting: Emergency Medicine

## 2019-04-01 ENCOUNTER — Emergency Department
Admission: EM | Admit: 2019-04-01 | Discharge: 2019-04-01 | Disposition: A | Discharge status: Home / Self Care | Payer: BLUE CROSS/BLUE SHIELD | Attending: Emergency Medicine | Admitting: Emergency Medicine

## 2019-04-01 ENCOUNTER — Other Ambulatory Visit: Payer: Self-pay

## 2019-04-01 DIAGNOSIS — S0992XA Unspecified injury of nose, initial encounter: Secondary | ICD-10-CM | POA: Diagnosis not present

## 2019-04-01 DIAGNOSIS — E039 Hypothyroidism, unspecified: Secondary | ICD-10-CM | POA: Diagnosis not present

## 2019-04-01 DIAGNOSIS — R55 Syncope and collapse: Secondary | ICD-10-CM | POA: Diagnosis not present

## 2019-04-01 DIAGNOSIS — Y929 Unspecified place or not applicable: Secondary | ICD-10-CM | POA: Insufficient documentation

## 2019-04-01 DIAGNOSIS — R05 Cough: Secondary | ICD-10-CM | POA: Diagnosis not present

## 2019-04-01 DIAGNOSIS — K5900 Constipation, unspecified: Secondary | ICD-10-CM

## 2019-04-01 DIAGNOSIS — U071 COVID-19: Secondary | ICD-10-CM | POA: Insufficient documentation

## 2019-04-01 DIAGNOSIS — Z79899 Other long term (current) drug therapy: Secondary | ICD-10-CM | POA: Insufficient documentation

## 2019-04-01 DIAGNOSIS — W19XXXA Unspecified fall, initial encounter: Secondary | ICD-10-CM | POA: Insufficient documentation

## 2019-04-01 DIAGNOSIS — E871 Hypo-osmolality and hyponatremia: Secondary | ICD-10-CM | POA: Insufficient documentation

## 2019-04-01 DIAGNOSIS — Z87891 Personal history of nicotine dependence: Secondary | ICD-10-CM | POA: Diagnosis not present

## 2019-04-01 DIAGNOSIS — Y998 Other external cause status: Secondary | ICD-10-CM | POA: Diagnosis not present

## 2019-04-01 DIAGNOSIS — Y9389 Activity, other specified: Secondary | ICD-10-CM | POA: Diagnosis not present

## 2019-04-01 DIAGNOSIS — S0990XA Unspecified injury of head, initial encounter: Secondary | ICD-10-CM | POA: Diagnosis present

## 2019-04-01 LAB — BASIC METABOLIC PANEL
Anion gap: 9 (ref 5–15)
BUN: 15 mg/dL (ref 8–23)
CO2: 26 mmol/L (ref 22–32)
Calcium: 9.4 mg/dL (ref 8.9–10.3)
Chloride: 92 mmol/L — ABNORMAL LOW (ref 98–111)
Creatinine, Ser: 0.82 mg/dL (ref 0.61–1.24)
GFR calc Af Amer: >60 mL/min (ref >60)
GFR calc non Af Amer: >60 mL/min (ref >60)
Glucose, Bld: 114 mg/dL — ABNORMAL HIGH (ref 70–99)
Potassium: 4.7 mmol/L (ref 3.5–5.1)
Sodium: 127 mmol/L — ABNORMAL LOW (ref 135–145)

## 2019-04-01 LAB — URINALYSIS, COMPLETE (UACMP) WITH MICROSCOPIC
Bilirubin Urine: NEGATIVE
Glucose, UA: NEGATIVE mg/dL
Hgb urine dipstick: NEGATIVE
Ketones, ur: NEGATIVE mg/dL
Leukocytes,Ua: NEGATIVE
Nitrite: NEGATIVE
Protein, ur: 30 mg/dL — AB
Specific Gravity, Urine: 1.02 (ref 1.005–1.030)
pH: 6 (ref 5.0–8.0)

## 2019-04-01 LAB — CBC
HCT: 33.6 % — ABNORMAL LOW (ref 39.0–52.0)
Hemoglobin: 11.2 g/dL — ABNORMAL LOW (ref 13.0–17.0)
MCH: 31.4 pg (ref 26.0–34.0)
MCHC: 33.3 g/dL (ref 30.0–36.0)
MCV: 94.1 fL (ref 80.0–100.0)
Platelets: 315 10*3/uL (ref 150–400)
RBC: 3.57 MIL/uL — ABNORMAL LOW (ref 4.22–5.81)
RDW: 13.1 % (ref 11.5–15.5)
WBC: 3.7 10*3/uL — ABNORMAL LOW (ref 4.0–10.5)
nRBC: 0 % (ref 0.0–0.2)

## 2019-04-01 LAB — TROPONIN I (HIGH SENSITIVITY): Troponin I (High Sensitivity): 3 ng/L (ref <18)

## 2019-04-01 MED ORDER — SENNOSIDES-DOCUSATE SODIUM 8.6-50 MG PO TABS: 1.0000 | ORAL_TABLET | Freq: Every day | ORAL | 0 refills | Status: AC

## 2019-04-01 MED ORDER — SODIUM CHLORIDE 0.9% FLUSH: 3.0000 mL | Freq: Once | INTRAVENOUS | Status: DC

## 2019-04-01 MED ORDER — SODIUM CHLORIDE 0.9 % IV BOLUS
1000.0000 mL | Freq: Once | INTRAVENOUS | Status: AC
  Administered 2019-04-01: 16:00:00 1000 mL via INTRAVENOUS

## 2019-04-01 NOTE — ED Triage Notes (Signed)
Patient presents to the ED post syncopal episode this morning.  Patient states he hit his head and face.  Patient does not take blood thinners.  Patient is in no obvious distress at this time.  Patient's nose appears swollen.

## 2019-04-01 NOTE — ED Provider Notes (Signed)
[facility] Emergency Department Provider Note  ____________________________________________  Time seen: Approximately 3:11 PM  I have reviewed the triage vital signs and the nursing notes.   HISTORY  Chief Complaint Loss of Consciousness    HPI Seth Miles is a 64 y.o. male presents to the emergency department for evaluation of coughing, syncope.  Patient reports that he has been coughing times several days.  Today patient states that he "passed out" and did fall.  Patient states that he struck his nose during the fall.  He is unsure how long he was unconscious but states "it was not long."  Patient remembers the events leading up to his syncopal episodes as well as the events afterwards.  Patient denies any weakness, visual changes, headache, chest pain, shortness of breath, abdominal pain, nausea or vomiting prior to onset of symptoms.  No history of syncopal episodes.  Patient endorses a history of hypothyroidism and anemia but no other chronic medical problems.  Patient denies any headache, vision changes, neck pain, chest pain, shortness of breath, abdominal pain, nausea vomiting .  Patient has had nasal congestion, cough for several days leading up to syncopal episode.  Patient denies any known sick contacts.         Past Medical History:  Diagnosis Date  . Anemia   . Hypothyroidism     Patient Active Problem List   Diagnosis Date Noted  . Iron deficiency anemia   . Other diseases of stomach and duodenum     Past Surgical History:  Procedure Laterality Date  . ESOPHAGOGASTRODUODENOSCOPY (EGD) WITH PROPOFOL N/A 06/15/2017   Procedure: ESOPHAGOGASTRODUODENOSCOPY (EGD) WITH PROPOFOL;  Surgeon: Lucilla Lame, MD;  Location: [facility];  Service: Endoscopy;  Laterality: N/A;  . HERNIA REPAIR  1980s    Prior to Admission medications   Medication Sig Start Date End Date Taking? Authorizing Provider  cholecalciferol (VITAMIN D) 1000  units tablet Take 1,000 Units by mouth daily.    [provider]  Ferrous Sulfate (IRON) 325 (65 Fe) MG TABS Take by mouth.    [provider]  levothyroxine (SYNTHROID, LEVOTHROID) 100 MCG tablet Take 100 mcg by mouth daily before breakfast.    [provider]  polyethylene glycol (GOLYTELY) 236 g solution Drink one 8 oz glass every 20 mins until stools are clear starting at 5:00pm on 06/14/17. 06/12/17   Lucilla Lame, MD  senna-docusate (SENOKOT-S) 8.6-50 MG tablet Take 1 tablet by mouth daily. 04/01/19   Camilah Spillman, Charline Bills, PA-C    Allergies Patient has no known allergies.  No family history on file.  Social History Social History   Tobacco Use  . Smoking status: Former Research scientist (life sciences)  . Smokeless tobacco: Never Used  . Tobacco comment: quit in his 36s  Substance Use Topics  . Alcohol use: No  . Drug use: No     Review of Systems  Constitutional: No fever/chills.  Positive for syncope Eyes: No visual changes. No discharge ENT: Positive for nasal congestion. Cardiovascular: no chest pain. Respiratory: Positive cough. No SOB. Gastrointestinal: No abdominal pain.  No nausea, no vomiting.  No diarrhea.  No constipation. Genitourinary: Negative for dysuria. No hematuria Musculoskeletal: Negative for musculoskeletal pain. Skin: Negative for rash, abrasions, lacerations, ecchymosis. Neurological: Negative for headaches, focal weakness or numbness.  Positive for syncope 10-point ROS otherwise negative.  ____________________________________________   PHYSICAL EXAM:  VITAL SIGNS: ED Triage Vitals  Enc Vitals Group     BP 04/01/19 1420 120/79  Pulse Rate 04/01/19 1420 90     Resp 04/01/19 1420 18     Temp 04/01/19 1420 98.7 F (37.1 C)     Temp Source 04/01/19 1420 Oral     SpO2 04/01/19 1420 99 %     Weight 04/01/19 1421 127 lb (57.6 kg)     Height 04/01/19 1421 5' 1"  (1.549 m)     Head Circumference --      Peak Flow --      Pain Score 04/01/19  1421 0     Pain Loc --      Pain Edu? --      Excl. in [place]? --      Constitutional: Alert and oriented. Well appearing and in no acute distress. Eyes: Conjunctivae are normal. PERRL. EOMI. Head: Superficial abrasion along the nasal bridge.  No active bleeding.  Patient does have mild edema about the nasal bridge as well.  Palpation along the nasal bridge reveals no palpable deficit or deformity.  No tenderness to palpation of the orbits, zygomatic arches.  No other tenderness to palpation over the structures of the face or skull.  No battle signs, raccoon eyes, serosanguineous fluid drainage from ears or nares. ENT:      Ears: EACs and TMs unremarkable bilaterally      Nose: Mild, clear congestion/rhinnorhea.  No evidence of epistaxis.      Mouth/Throat: Mucous membranes are moist.  Oropharynx is nonerythematous and nonedematous.  Uvula is midline Neck: No stridor.  No cervical spine tenderness to palpation. Hematological/Lymphatic/Immunilogical: No cervical lymphadenopathy. Cardiovascular: Normal rate, regular rhythm. Normal S1 and S2.  Good peripheral circulation. Respiratory: Normal respiratory effort without tachypnea or retractions. Lungs CTAB. Good air entry to the bases with no decreased or absent breath sounds. Gastrointestinal: Bowel sounds 4 quadrants. Soft and nontender to palpation. No guarding or rigidity. No palpable masses. No distention. No CVA tenderness. Musculoskeletal: Full range of motion to all extremities. No gross deformities appreciated. Neurologic:  Normal speech and language. No gross focal neurologic deficits are appreciated.  Cranial nerve testing 2 through 12 grossly intact with negative Romberg's and pronator drift. Skin:  Skin is warm, dry and intact. No rash noted. Psychiatric: Mood and affect are normal. Speech and behavior are normal. Patient exhibits appropriate insight and judgement.   ____________________________________________   LABS (all labs  ordered are listed, but only abnormal results are displayed)  Labs Reviewed  BASIC METABOLIC PANEL - Abnormal; Notable for the following components:      Result Value   Sodium 127 (*)    Chloride 92 (*)    Glucose, Bld 114 (*)    All other components within normal limits  CBC - Abnormal; Notable for the following components:   WBC 3.7 (*)    RBC 3.57 (*)    Hemoglobin 11.2 (*)    HCT 33.6 (*)    All other components within normal limits  URINALYSIS, COMPLETE (UACMP) WITH MICROSCOPIC - Abnormal; Notable for the following components:   Color, Urine YELLOW (*)    APPearance HAZY (*)    Protein, ur 30 (*)    Bacteria, UA RARE (*)    All other components within normal limits  SARS CORONAVIRUS 2 (TAT 6-24 HRS)  CBG MONITORING, ED  TROPONIN I (HIGH SENSITIVITY)   ____________________________________________  EKG  ED ECG REPORT I, Charline Bills Jalena Vanderlinden,  personally viewed and interpreted this ECG.   Date: 04/01/2019  EKG Time: 1553 hrs.  Rate: 87 bpm  Rhythm:  normal EKG, normal sinus rhythm, there are no previous tracings available for comparison  Axis: Normal axis  Intervals:none  ST&T Change: No ST elevation or depression noted  No STEMI.  Normal EKG.  No previous EKGs for comparison.  ____________________________________________  RADIOLOGY I personally viewed and evaluated these images as part of my medical decision making, as well as reviewing the written report by the radiologist.  CT HEAD WO CONTRAST  Result Date: 04/01/2019 CLINICAL DATA:  Syncope. EXAM: CT HEAD WITHOUT CONTRAST TECHNIQUE: Contiguous axial images were obtained from the base of the skull through the vertex without intravenous contrast. COMPARISON:  None. FINDINGS: Brain: No evidence of acute infarction, hemorrhage, hydrocephalus, extra-axial collection or mass lesion/mass effect. Vascular: No hyperdense vessel or unexpected calcification. Skull: Normal. Negative for fracture or focal lesion.  Sinuses/Orbits: No acute finding. Other: None. IMPRESSION: Normal head CT. Electronically Signed   By: Marijo Conception M.D.   On: 04/01/2019 14:54    ____________________________________________    PROCEDURES  Procedure(s) performed:    Procedures    Medications  sodium chloride flush (NS) 0.9 % injection 3 mL (3 mLs Intravenous Not Given 04/01/19 1540)  sodium chloride 0.9 % bolus 1,000 mL (1,000 mLs Intravenous New Bag/Given 04/01/19 1552)     ____________________________________________   INITIAL IMPRESSION / ASSESSMENT AND PLAN / ED COURSE  Pertinent labs & imaging results that were available during my care of the patient were reviewed by me and considered in my medical decision making (see chart for details).  Review of the [place] CSRS was performed in accordance of the [place] prior to dispensing any controlled drugs.  Clinical Course as of Mar 31 1656  Tue Apr 01, 2019  1604 Patient presented today with a syncopal episode.  Patient had URI type symptoms for several days leading up to this episode.  He denies any other complaints.  On my initial assessment, most of patient's labs, CT head had returned.  No evidence of acute intracranial abnormality such as mass or hemorrhage or ischemia.  Labs were overall reassuring.  Patient did have sodium of 127.  On review of patient's medical records, outside labs appear that patient does have chronic hyponatremia, most recent labs from 2 months ago revealed sodium of 125.  Patient denies any symptoms consistent with hyponatremia other than syncope.  At this time awaiting troponin to conclude syncopal work-up.  Given patient's URI symptoms prior to onset I will test the patient for Covid as well.  Vital signs have been stable.  Exam is reassuring.  Patient was neurologically intact at this time.   [JC]  1623 Imaging ordered of patient's nose as he had a superficial laceration, swelling to the nasal bridge.  Patient adamantly declines any imaging  as he states "there is nothing wrong with my nose."  Patient is competent to make his own decisions and understands why I would like to proceed with imaging to ensure no underlying fractures.  At this time nasal bone imaging will be discontinued   [JC]  1624  Patient's sister apparently called the emergency department to inform staff that "the patient will not tell the whole story". She reports that the patient is also constipated.  Patient's family would like his constipation addressed.  No additional information provided around patient's syncopal episode.   [JC]  1648 Patient's EKG, initial troponin reassuring.  Patient is not having any cardiac type pain.  Will counsel second troponin at this time.  Patient is at baseline, wishing to leave  at this time.  I feel this is reasonable as patient has received a liter of fluid, appears to have chronically low sodium.  No indication for admission at this time.  Covid test is still pending with patient's symptoms I feel Covid is a likely diagnosis.  As such I discussed quarantine until patient receives results.  Patient may treat with over-the-counter medications at home.  I encouraged patient to consume good oral intake of sodium.  Return to the emergency department for any new or worsening symptoms.   [JC]    Clinical Course User Index [JC] Kynzleigh Bandel, Charline Bills, PA-C          Patient's diagnosis is consistent with syncope with injury to the nose, hyponatremia, constipation.  Patient presented primarily for a syncopal episode today.  Patient had no other associated symptoms of headache, vision changes, chest pain, shortness of breath, nausea.  Patient remembered events leading up to and following syncope.  No recent trauma.  Patient did have mild leukopenia, nasal congestion, cough.  As such patient will be tested for COVID-19.  Patient did have a finding of hyponatremia which appears to be chronic as patient has had other labs with findings consistent  with hyponatremia.  Patient did have a syncopal episode but had no other complaints concerning for hyponatremia.  As such patient was given a liter of fluid and is instructed to increase his dietary intake of salt.  EKG, troponin is reassuring.  Patient has a history of chronic constipation and is currently endorsing constipation.  Patient will be placed on medications for same.  I discussed that results of COVID-19 testing would not return until tomorrow.  Patient is very anxious to go home, I discussed all of the lab, imaging and physical exam findings with the patient.  Patient verbalizes understanding of his hyponatremia, that he is to quarantine until COVID-19 testing returns.  Return for any onset of chest pain, headache, vision changes, return syncope.  Patient will be discharged home with prescriptions for Senokot. Patient is to follow up with primary care as needed or otherwise directed. Patient is given ED precautions to return to the ED for any worsening or new symptoms.     ____________________________________________  FINAL CLINICAL IMPRESSION(S) / ED DIAGNOSES  Final diagnoses:  Syncope and collapse  Injury to nose, initial encounter  Hyponatremia  Constipation, unspecified constipation type      NEW MEDICATIONS STARTED DURING THIS VISIT:  ED Discharge Orders         Ordered    senna-docusate (SENOKOT-S) 8.6-50 MG tablet  Daily     04/01/19 1657              This chart was dictated using voice recognition software/Dragon. Despite best efforts to proofread, errors can occur which can change the meaning. Any change was purely unintentional.    Darletta Moll, PA-C 04/01/19 1658    Harvest Dark, MD 04/01/19 2344

## 2019-04-02 LAB — SARS CORONAVIRUS 2 (TAT 6-24 HRS): SARS Coronavirus 2: POSITIVE — AB

## 2019-04-04 ENCOUNTER — Telehealth: Payer: Self-pay | Admitting: Emergency Medicine

## 2019-04-04 NOTE — Telephone Encounter (Signed)
Called patient to inform of positive covid test.  Left message.

## 2019-04-04 NOTE — Telephone Encounter (Signed)
Patient called me back.  Explained result and isolation.
